# Patient Record
Sex: Male | Born: 2000 | Race: Black or African American | Hispanic: No | Marital: Single | Smoking: Never smoker
Health system: Southern US, Community
[De-identification: ages and names within clinical notes are randomized; demographics above are authoritative.]

## PROBLEM LIST (undated history)

## (undated) ENCOUNTER — Ambulatory Visit (HOSPITAL_COMMUNITY): Admission: EM | Payer: Medicaid Other | Source: Home / Self Care

---
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
## 2022-05-28 ENCOUNTER — Encounter (HOSPITAL_COMMUNITY): Payer: Self-pay | Admitting: *Deleted

## 2022-05-28 ENCOUNTER — Ambulatory Visit (HOSPITAL_COMMUNITY)
Admission: EM | Admit: 2022-05-28 | Discharge: 2022-05-28 | Disposition: A | Discharge status: Home / Self Care | Payer: Medicaid Other | Attending: Emergency Medicine | Admitting: Emergency Medicine

## 2022-05-28 ENCOUNTER — Other Ambulatory Visit: Payer: Self-pay

## 2022-05-28 DIAGNOSIS — N489 Disorder of penis, unspecified: Secondary | ICD-10-CM | POA: Insufficient documentation

## 2022-05-28 DIAGNOSIS — Z202 Contact with and (suspected) exposure to infections with a predominantly sexual mode of transmission: Secondary | ICD-10-CM | POA: Insufficient documentation

## 2022-05-28 MED ORDER — DOXYCYCLINE HYCLATE 100 MG PO CAPS: 100.0000 mg | ORAL_CAPSULE | Freq: Two times a day (BID) | ORAL | 0 refills | Status: DC
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
## 2022-05-28 NOTE — Discharge Instructions (Addendum)
Lesions to the penis at this time or not consistent with herpes however please monitor closely and if they occur again and appear like blisters that are painful or itchy please return to get checked  Today you are being treated prophylactically for chlamydia, take doxycycline every morning and every evening for 7 days, please refrain from having sex until you and all partners have completed treatment   Labs pending 2-3 days, you will be contacted if positive for any sti and treatment will be sent to the pharmacy, you will have to return to the clinic if positive for gonorrhea to receive treatment   Please refrain from having sex until labs results, if positive please refrain from having sex until treatment complete and symptoms resolve   If positive for Chlamydia  gonorrhea or trichomoniasis please notify partner or partners so they may tested as well  Moving forward, it is recommended you use some form of protection against the transmission of sti infections  such as condoms or dental dams with each sexual encounter

## 2022-05-28 NOTE — ED Triage Notes (Addendum)
Pt reports Partner tested positive for STD. Pt reports he has white spots on his penis and skin is red.

## 2022-05-31 LAB — CYTOLOGY, (ORAL, ANAL, URETHRAL) ANCILLARY ONLY
Chlamydia: POSITIVE — AB
Comment: NEGATIVE
Comment: NEGATIVE
Comment: NORMAL
Neisseria Gonorrhea: NEGATIVE
Trichomonas: NEGATIVE

## 2023-04-27 ENCOUNTER — Other Ambulatory Visit: Payer: Self-pay

## 2023-04-27 ENCOUNTER — Emergency Department (HOSPITAL_BASED_OUTPATIENT_CLINIC_OR_DEPARTMENT_OTHER)
Admission: EM | Admit: 2023-04-27 | Discharge: 2023-04-27 | Disposition: A | Discharge status: Home / Self Care | Attending: Emergency Medicine | Admitting: Emergency Medicine

## 2023-04-27 ENCOUNTER — Encounter (HOSPITAL_BASED_OUTPATIENT_CLINIC_OR_DEPARTMENT_OTHER): Payer: Self-pay | Admitting: Emergency Medicine

## 2023-04-27 ENCOUNTER — Emergency Department (HOSPITAL_BASED_OUTPATIENT_CLINIC_OR_DEPARTMENT_OTHER)

## 2023-04-27 DIAGNOSIS — N50811 Right testicular pain: Secondary | ICD-10-CM | POA: Diagnosis present

## 2023-04-27 DIAGNOSIS — N451 Epididymitis: Secondary | ICD-10-CM | POA: Diagnosis not present

## 2023-04-27 LAB — URINALYSIS, W/ REFLEX TO CULTURE (INFECTION SUSPECTED)
Bacteria, UA: NONE SEEN
Bilirubin Urine: NEGATIVE
Glucose, UA: NEGATIVE mg/dL
Hgb urine dipstick: NEGATIVE
Ketones, ur: NEGATIVE mg/dL
Leukocytes,Ua: NEGATIVE
Nitrite: NEGATIVE
Specific Gravity, Urine: 1.031 — ABNORMAL HIGH (ref 1.005–1.030)
pH: 6.5 (ref 5.0–8.0)

## 2023-04-27 LAB — CBC WITH DIFFERENTIAL/PLATELET
Abs Immature Granulocytes: 0.02 10*3/uL (ref 0.00–0.07)
Basophils Absolute: 0 10*3/uL (ref 0.0–0.1)
Basophils Relative: 1 %
Eosinophils Absolute: 0 10*3/uL (ref 0.0–0.5)
Eosinophils Relative: 0 %
HCT: 43.6 % (ref 39.0–52.0)
Hemoglobin: 14.6 g/dL (ref 13.0–17.0)
Immature Granulocytes: 0 %
Lymphocytes Relative: 23 %
Lymphs Abs: 1.8 10*3/uL (ref 0.7–4.0)
MCH: 30 pg (ref 26.0–34.0)
MCHC: 33.5 g/dL (ref 30.0–36.0)
MCV: 89.5 fL (ref 80.0–100.0)
Monocytes Absolute: 0.8 10*3/uL (ref 0.1–1.0)
Monocytes Relative: 10 %
Neutro Abs: 5 10*3/uL (ref 1.7–7.7)
Neutrophils Relative %: 66 %
Platelets: 216 10*3/uL (ref 150–400)
RBC: 4.87 MIL/uL (ref 4.22–5.81)
RDW: 13.2 % (ref 11.5–15.5)
WBC: 7.6 10*3/uL (ref 4.0–10.5)
nRBC: 0 % (ref 0.0–0.2)

## 2023-04-27 LAB — COMPREHENSIVE METABOLIC PANEL WITH GFR
ALT: 10 U/L (ref 0–44)
AST: 9 U/L — ABNORMAL LOW (ref 15–41)
Albumin: 4.5 g/dL (ref 3.5–5.0)
Alkaline Phosphatase: 60 U/L (ref 38–126)
Anion gap: 10 (ref 5–15)
BUN: 7 mg/dL (ref 6–20)
CO2: 24 mmol/L (ref 22–32)
Calcium: 9.2 mg/dL (ref 8.9–10.3)
Chloride: 103 mmol/L (ref 98–111)
Creatinine, Ser: 0.8 mg/dL (ref 0.61–1.24)
GFR, Estimated: >60 mL/min (ref >60)
Glucose, Bld: 82 mg/dL (ref 70–99)
Potassium: 4 mmol/L (ref 3.5–5.1)
Sodium: 138 mmol/L (ref 135–145)
Total Bilirubin: 0.7 mg/dL (ref 0.0–1.2)
Total Protein: 7 g/dL (ref 6.5–8.1)

## 2023-04-27 LAB — HIV ANTIBODY (ROUTINE TESTING W REFLEX): HIV Screen 4th Generation wRfx: NONREACTIVE

## 2023-04-27 MED ORDER — DOXYCYCLINE HYCLATE 100 MG PO TABS
100.0000 mg | ORAL_TABLET | Freq: Once | ORAL | Status: AC
  Administered 2023-04-27: 100 mg via ORAL
  Filled 2023-04-27: qty 1

## 2023-04-27 MED ORDER — LIDOCAINE HCL (PF) 1 % IJ SOLN: 1.0000 mL | Freq: Once | INTRAMUSCULAR | Status: AC

## 2023-04-27 MED ORDER — CEFTRIAXONE SODIUM 500 MG IJ SOLR
500.0000 mg | Freq: Once | INTRAMUSCULAR | Status: AC
  Administered 2023-04-27: 500 mg via INTRAMUSCULAR
  Filled 2023-04-27: qty 500

## 2023-04-27 MED ORDER — LIDOCAINE HCL (PF) 1 % IJ SOLN
INTRAMUSCULAR | Status: AC
  Administered 2023-04-27: 1 mL
  Filled 2023-04-27: qty 5

## 2023-04-27 MED ORDER — DOXYCYCLINE HYCLATE 100 MG PO CAPS: 100.0000 mg | ORAL_CAPSULE | Freq: Two times a day (BID) | ORAL | 0 refills | Status: DC

## 2023-04-27 MED ORDER — OXYCODONE-ACETAMINOPHEN 5-325 MG PO TABS
1.0000 | ORAL_TABLET | Freq: Once | ORAL | Status: AC
  Administered 2023-04-27: 1 via ORAL
  Filled 2023-04-27: qty 1

## 2023-04-27 NOTE — Discharge Instructions (Addendum)
 It was a pleasure caring for you today.  Please follow-up with the urologist listed in this discharge paperwork in the next 7 days.  Please also follow-up with your primary care provider.  Seek emergency care experiencing any new or worsening symptoms.  Alternating between 650 mg Tylenol  and 400 mg Advil : The best way to alternate taking Acetaminophen  (example Tylenol ) and Ibuprofen  (example Advil /Motrin ) is to take them 3 hours apart. For example, if you take ibuprofen  at 6 am you can then take Tylenol  at 9 am. You can continue this regimen throughout the day, making sure you do not exceed the recommended maximum dose for each drug.
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
## 2023-04-27 NOTE — ED Notes (Signed)

## 2023-04-28 LAB — GC/CHLAMYDIA PROBE AMP (~~LOC~~) NOT AT ARMC
Chlamydia: NEGATIVE
Comment: NEGATIVE
Comment: NORMAL
Neisseria Gonorrhea: NEGATIVE

## 2023-08-21 ENCOUNTER — Ambulatory Visit (HOSPITAL_COMMUNITY): Admission: EM | Admit: 2023-08-21 | Discharge: 2023-08-21 | Disposition: A | Discharge status: Home / Self Care

## 2023-08-21 ENCOUNTER — Ambulatory Visit (HOSPITAL_COMMUNITY)
Admission: EM | Admit: 2023-08-21 | Discharge: 2023-08-21 | Disposition: A | Discharge status: Home / Self Care | Attending: Internal Medicine | Admitting: Internal Medicine

## 2023-08-21 ENCOUNTER — Other Ambulatory Visit: Payer: Self-pay

## 2023-08-21 ENCOUNTER — Encounter (HOSPITAL_COMMUNITY): Payer: Self-pay | Admitting: Emergency Medicine

## 2023-08-21 DIAGNOSIS — Z202 Contact with and (suspected) exposure to infections with a predominantly sexual mode of transmission: Secondary | ICD-10-CM | POA: Insufficient documentation

## 2023-08-21 DIAGNOSIS — Z113 Encounter for screening for infections with a predominantly sexual mode of transmission: Secondary | ICD-10-CM | POA: Diagnosis present

## 2023-08-21 MED ORDER — CEFTRIAXONE SODIUM 500 MG IJ SOLR
500.0000 mg | INTRAMUSCULAR | Status: DC
  Administered 2023-08-21: 500 mg via INTRAMUSCULAR

## 2023-08-21 MED ORDER — CEFTRIAXONE SODIUM 500 MG IJ SOLR
INTRAMUSCULAR | Status: AC
  Filled 2023-08-21: qty 500

## 2023-08-21 MED ORDER — LIDOCAINE HCL (PF) 1 % IJ SOLN
INTRAMUSCULAR | Status: AC
  Filled 2023-08-21: qty 2

## 2023-08-21 NOTE — ED Triage Notes (Signed)
 Patient has concerned that his previous partner was positive for gonorrhea.  Requests testing and treatment.  Has had pain and penile discharge

## 2023-08-21 NOTE — ED Provider Notes (Signed)
 [facility]    CSN: [id number] Arrival date & time: 08/21/23  1619      History   Chief Complaint Chief Complaint  Patient presents with   Exposure to STD    HPI Mathew Thornton is a 23 y.o. male.   23 year old male presents urgent care requesting STI testing.  He reports that he is girlfriend let him know that she tested positive for gonorrhea.  He is also requesting treatment of this.  He does report some penile pain and penile discharge.  He denies fever, chills, nausea, vomiting, abdominal pain.   Exposure to STD Pertinent negatives include no chest pain, no abdominal pain and no shortness of breath.    History reviewed. No pertinent past medical history.  There are no active problems to display for this patient.   History reviewed. No pertinent surgical history.     Home Medications    Prior to Admission medications   Medication Sig Start Date End Date Taking? Authorizing Provider  bacitracin  500 UNIT/GM ointment Apply 1 application topically 2 (two) times daily. Patient not taking: Reported on 08/21/2023 09/05/15   Eilleen Colander, NP  doxycycline  (VIBRAMYCIN ) 100 MG capsule Take 1 capsule (100 mg total) by mouth 2 (two) times daily. Patient not taking: Reported on 08/21/2023 04/27/23   Hoy Nidia FALCON, PA-C  ibuprofen  (ADVIL ,MOTRIN ) 400 MG tablet Take 1 tablet (400 mg total) by mouth every 6 (six) hours as needed. Patient not taking: Reported on 08/21/2023 11/13/13   Catherne Nest, PA-C    Family History History reviewed. No pertinent family history.  Social History Social History   Tobacco Use   Smoking status: Never   Smokeless tobacco: Never  Vaping Use   Vaping status: Never Used  Substance Use Topics   Alcohol use: Never   Drug use: Never     Allergies   Patient has no known allergies.   Review of Systems Review of Systems  Constitutional:  Negative for chills and fever.  HENT:  Negative for ear pain and sore throat.    Eyes:  Negative for pain and visual disturbance.  Respiratory:  Negative for cough and shortness of breath.   Cardiovascular:  Negative for chest pain and palpitations.  Gastrointestinal:  Negative for abdominal pain and vomiting.  Genitourinary:  Positive for penile discharge and penile pain. Negative for dysuria and hematuria.  Musculoskeletal:  Negative for arthralgias and back pain.  Skin:  Negative for color change and rash.  Neurological:  Negative for seizures and syncope.  All other systems reviewed and are negative.    Physical Exam Triage Vital Signs ED Triage Vitals  Encounter Vitals Group     BP 08/21/23 1734 105/70     Girls Systolic BP Percentile --      Girls Diastolic BP Percentile --      Boys Systolic BP Percentile --      Boys Diastolic BP Percentile --      Pulse Rate 08/21/23 1734 61     Resp 08/21/23 1734 15     Temp 08/21/23 1734 98.4 F (36.9 C)     Temp Source 08/21/23 1734 Oral     SpO2 08/21/23 1734 100 %     Weight --      Height --      Head Circumference --      Peak Flow --      Pain Score 08/21/23 1732 0     Pain Loc --  Pain Education --      Exclude from Growth Chart --    No data found.  Updated Vital Signs BP 105/70 (BP Location: Right Arm)   Pulse 61   Temp 98.4 F (36.9 C) (Oral)   Resp 15   SpO2 100%   Visual Acuity Right Eye Distance:   Left Eye Distance:   Bilateral Distance:    Right Eye Near:   Left Eye Near:    Bilateral Near:     Physical Exam Vitals and nursing note reviewed.  Constitutional:      General: He is not in acute distress.    Appearance: He is well-developed.  HENT:     Head: Normocephalic and atraumatic.  Eyes:     Conjunctiva/sclera: Conjunctivae normal.  Cardiovascular:     Rate and Rhythm: Normal rate and regular rhythm.     Heart sounds: No murmur heard. Pulmonary:     Effort: Pulmonary effort is normal. No respiratory distress.     Breath sounds: Normal breath sounds.   Abdominal:     Palpations: Abdomen is soft.     Tenderness: There is no abdominal tenderness.  Musculoskeletal:        General: No swelling.     Cervical back: Neck supple.  Skin:    General: Skin is warm and dry.     Capillary Refill: Capillary refill takes less than 2 seconds.  Neurological:     Mental Status: He is alert.  Psychiatric:        Mood and Affect: Mood normal.      UC Treatments / Results  Labs (all labs ordered are listed, but only abnormal results are displayed) Labs Reviewed  CYTOLOGY, (ORAL, ANAL, URETHRAL) ANCILLARY ONLY    EKG   Radiology No results found.  Procedures Procedures (including critical care time)  Medications Ordered in UC Medications  cefTRIAXone  (ROCEPHIN ) injection 500 mg (has no administration in time range)    Initial Impression / Assessment and Plan / UC Course  I have reviewed the triage vital signs and the nursing notes.  Pertinent labs & imaging results that were available during my care of the patient were reviewed by me and considered in my medical decision making (see chart for details).     Screening examination for STI  Exposure to gonorrhea   Known gonorrhea exposure.  We have treated this today with an injection of ceftriaxone .  Screening swab done today and results will be available in 24-48 hours. We will contact you if we need to arrange additional treatment based on your testing. Negative results will be on your MyChart account. Abstain from sex until you receive your final results.  Use a condom for sexual encounters. If you have any worsening or changing symptoms including abnormal discharge, abdominal pain, fever, nausea, or vomiting, then you should be reevaluated.    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  We have treated this today with an injection of  ceftriaxone .  Screening swab done today and results will be available in 24-48 hours. We will contact you if we need to arrange additional treatment based on your testing. Negative results will be on your MyChart account. Abstain from sex until you receive your final results.  Use a condom for sexual encounters. If you have any worsening or changing symptoms including abnormal discharge,  abdominal pain, fever, nausea, or vomiting, then you should be reevaluated.      ED Prescriptions   None    PDMP not reviewed this encounter.   Teresa Almarie LABOR, [place] 08/21/23 [phone]

## 2023-08-21 NOTE — Discharge Instructions (Addendum)
 Known gonorrhea exposure.  We have treated this today with an injection of ceftriaxone .  Screening swab done today and results will be available in 24-48 hours. We will contact you if we need to arrange additional treatment based on your testing. Negative results will be on your MyChart account. Abstain from sex until you receive your final results.  Use a condom for sexual encounters. If you have any worsening or changing symptoms including abnormal discharge, abdominal pain, fever, nausea, or vomiting, then you should be reevaluated.

## 2023-08-22 ENCOUNTER — Ambulatory Visit (HOSPITAL_COMMUNITY): Payer: Self-pay

## 2023-08-22 LAB — CYTOLOGY, (ORAL, ANAL, URETHRAL) ANCILLARY ONLY
Chlamydia: POSITIVE — AB
Comment: NEGATIVE
Comment: NEGATIVE
Comment: NORMAL
Neisseria Gonorrhea: NEGATIVE
Trichomonas: NEGATIVE

## 2023-08-22 MED ORDER — DOXYCYCLINE HYCLATE 100 MG PO TABS: 100.0000 mg | ORAL_TABLET | Freq: Two times a day (BID) | ORAL | 0 refills | Status: AC

## 2023-11-08 ENCOUNTER — Ambulatory Visit (HOSPITAL_COMMUNITY)
Admission: EM | Admit: 2023-11-08 | Discharge: 2023-11-08 | Disposition: A | Discharge status: Home / Self Care | Attending: Physician Assistant | Admitting: Physician Assistant

## 2023-11-08 ENCOUNTER — Other Ambulatory Visit: Payer: Self-pay

## 2023-11-08 ENCOUNTER — Encounter (HOSPITAL_COMMUNITY): Payer: Self-pay

## 2023-11-08 ENCOUNTER — Emergency Department (HOSPITAL_COMMUNITY)
Admission: EM | Admit: 2023-11-08 | Discharge: 2023-11-09 | Discharge status: Against Medical Advice | Source: Ambulatory Visit | Attending: Emergency Medicine | Admitting: Emergency Medicine

## 2023-11-08 ENCOUNTER — Emergency Department (HOSPITAL_COMMUNITY)

## 2023-11-08 DIAGNOSIS — L089 Local infection of the skin and subcutaneous tissue, unspecified: Secondary | ICD-10-CM | POA: Diagnosis present

## 2023-11-08 DIAGNOSIS — Z113 Encounter for screening for infections with a predominantly sexual mode of transmission: Secondary | ICD-10-CM | POA: Diagnosis not present

## 2023-11-08 DIAGNOSIS — L02215 Cutaneous abscess of perineum: Secondary | ICD-10-CM | POA: Insufficient documentation

## 2023-11-08 DIAGNOSIS — Z5321 Procedure and treatment not carried out due to patient leaving prior to being seen by health care provider: Secondary | ICD-10-CM | POA: Insufficient documentation

## 2023-11-08 DIAGNOSIS — R509 Fever, unspecified: Secondary | ICD-10-CM | POA: Diagnosis not present

## 2023-11-08 DIAGNOSIS — A749 Chlamydial infection, unspecified: Secondary | ICD-10-CM | POA: Diagnosis not present

## 2023-11-08 DIAGNOSIS — K610 Anal abscess: Secondary | ICD-10-CM | POA: Diagnosis not present

## 2023-11-08 DIAGNOSIS — A599 Trichomoniasis, unspecified: Secondary | ICD-10-CM | POA: Diagnosis not present

## 2023-11-08 LAB — CBC WITH DIFFERENTIAL/PLATELET
Abs Immature Granulocytes: 0.07 K/uL (ref 0.00–0.07)
Basophils Absolute: 0.1 K/uL (ref 0.0–0.1)
Basophils Relative: 0 %
Eosinophils Absolute: 0 K/uL (ref 0.0–0.5)
Eosinophils Relative: 0 %
HCT: 46.4 % (ref 39.0–52.0)
Hemoglobin: 15.4 g/dL (ref 13.0–17.0)
Immature Granulocytes: 0 %
Lymphocytes Relative: 12 %
Lymphs Abs: 2.1 K/uL (ref 0.7–4.0)
MCH: 30.1 pg (ref 26.0–34.0)
MCHC: 33.2 g/dL (ref 30.0–36.0)
MCV: 90.6 fL (ref 80.0–100.0)
Monocytes Absolute: 2.2 K/uL — ABNORMAL HIGH (ref 0.1–1.0)
Monocytes Relative: 13 %
Neutro Abs: 12.3 K/uL — ABNORMAL HIGH (ref 1.7–7.7)
Neutrophils Relative %: 75 %
Platelets: 225 K/uL (ref 150–400)
RBC: 5.12 MIL/uL (ref 4.22–5.81)
RDW: 13.6 % (ref 11.5–15.5)
WBC: 16.7 K/uL — ABNORMAL HIGH (ref 4.0–10.5)
nRBC: 0 % (ref 0.0–0.2)

## 2023-11-08 LAB — BASIC METABOLIC PANEL WITH GFR
Anion gap: 10 (ref 5–15)
BUN: 9 mg/dL (ref 6–20)
CO2: 28 mmol/L (ref 22–32)
Calcium: 9.1 mg/dL (ref 8.9–10.3)
Chloride: 100 mmol/L (ref 98–111)
Creatinine, Ser: 0.84 mg/dL (ref 0.61–1.24)
GFR, Estimated: >60 mL/min (ref >60)
Glucose, Bld: 91 mg/dL (ref 70–99)
Potassium: 3.6 mmol/L (ref 3.5–5.1)
Sodium: 138 mmol/L (ref 135–145)

## 2023-11-08 MED ORDER — IOHEXOL 350 MG/ML SOLN
75.0000 mL | Freq: Once | INTRAVENOUS | Status: AC | PRN
  Administered 2023-11-08: 75 mL via INTRAVENOUS

## 2023-11-08 MED ORDER — OXYCODONE-ACETAMINOPHEN 5-325 MG PO TABS
1.0000 | ORAL_TABLET | ORAL | Status: DC | PRN
  Administered 2023-11-08: 1 via ORAL
  Filled 2023-11-08: qty 1

## 2023-11-08 NOTE — ED Triage Notes (Signed)
 Patient here today with c/o an abscess between his scrotum and anus X 1 week. Patient states that it busted yesterday.   Patient would also like to be tested for Stds. Swab only. Denies symptoms.

## 2023-11-08 NOTE — ED Provider Triage Note (Signed)
 Emergency Medicine Provider Triage Evaluation Note  Mathew Thornton , a 23 y.o. male  was evaluated in triage.  Pt complains of perineal abscess x 8 days. Seen by UC and told to come here for imaging. Had abscesses in past.    Endorses subjective fevers, body aches, chills  Denies headaches, vision changes, chest pain, shortness of breath, abdominal pain, n/v/d, dysuria, discharge.   Review of Systems  Positive: N/a Negative: N/a  Physical Exam  BP 138/85 (BP Location: Left Arm)   Pulse 83   Temp 99.1 F (37.3 C) (Oral)   Resp 16   Ht 5' 11 (1.803 m)   Wt 68 kg   SpO2 97%   BMI 20.92 kg/m  Gen:   Awake, no distress   Resp:  Normal effort  MSK:   Moves extremities without difficulty  Other:    Medical Decision Making  Medically screening exam initiated at 2:58 PM.  Appropriate orders placed.  Mathew Thornton was informed that the remainder of the evaluation will be completed by another provider, this initial triage assessment does not replace that evaluation, and the importance of remaining in the ED until their evaluation is complete.     Beola Terrall RAMAN, PA-C 11/08/23 1500

## 2023-11-08 NOTE — ED Notes (Signed)
 Patient is being discharged from the Urgent Care and sent to the Emergency Department via private vehicle . Per provider, patient is in need of higher level of care due to possible deep perineum infection. Patient is aware and verbalizes understanding of plan of care.  Vitals:   11/08/23 1308  BP: 135/74  Pulse: 78  Resp: 16  Temp: 98.7 F (37.1 C)  SpO2: 98%

## 2023-11-08 NOTE — ED Provider Notes (Signed)
 [facility]    CSN: [id number] Arrival date & time: 11/08/23  1139      History   Chief Complaint Chief Complaint  Patient presents with   Abscess   SEXUALLY TRANSMITTED DISEASE    HPI Mathew Thornton is a 23 y.o. male.  has no past medical history on file.   HPI  Pt is here today for a concern of a bump between the scrotum and rectum that has been present for about 7-8 days. He states he tried to pop it yesterday with scant amount of drainage. He reports most of the material popped on the inside. He would also like STD screening today  He denies symptoms such as dysuria, penile discharge, abdominal pain He states he did have chills last night and had to get multiple covers to help with this.   He states the bump is affecting his walking and ability to sit due to discomfort He denies previous hx of diabetes or immune system compromise.  He denies that he waxes, shaves or trims the area affected by the bump but is concerned for potential ingrown hair.    History reviewed. No pertinent past medical history.  There are no active problems to display for this patient.   History reviewed. No pertinent surgical history.     Home Medications    Prior to Admission medications   Not on File    Family History History reviewed. No pertinent family history.  Social History Social History   Tobacco Use   Smoking status: Never   Smokeless tobacco: Never  Vaping Use   Vaping status: Never Used  Substance Use Topics   Alcohol use: Never   Drug use: Not Currently     Allergies   Patient has no known allergies.   Review of Systems Review of Systems  Constitutional:  Positive for chills. Negative for fever.  Gastrointestinal:  Negative for abdominal pain.  Genitourinary:  Positive for testicular pain. Negative for dysuria, penile discharge, penile pain, penile swelling and scrotal swelling.  Skin:        Concern for abscess in genital region       Physical Exam Triage Vital Signs ED Triage Vitals  Encounter Vitals Group     BP 11/08/23 1308 135/74     Girls Systolic BP Percentile --      Girls Diastolic BP Percentile --      Boys Systolic BP Percentile --      Boys Diastolic BP Percentile --      Pulse Rate 11/08/23 1308 78     Resp 11/08/23 1308 16     Temp 11/08/23 1308 98.7 F (37.1 C)     Temp Source 11/08/23 1308 Oral     SpO2 11/08/23 1308 98 %     Weight --      Height --      Head Circumference --      Peak Flow --      Pain Score 11/08/23 1307 7     Pain Loc --      Pain Education --      Exclude from Growth Chart --    No data found.  Updated Vital Signs BP 135/74 (BP Location: Left Arm)   Pulse 78   Temp 98.7 F (37.1 C) (Oral)   Resp 16   SpO2 98%   Visual Acuity Right Eye Distance:   Left Eye Distance:   Bilateral Distance:    Right Eye Near:  Left Eye Near:    Bilateral Near:     Physical Exam Vitals reviewed. Exam conducted with a chaperone present.  Constitutional:      General: He is awake.     Appearance: Normal appearance. He is well-developed and well-groomed.  HENT:     Head: Normocephalic and atraumatic.  Eyes:     Extraocular Movements: Extraocular movements intact.     Conjunctiva/sclera: Conjunctivae normal.  Pulmonary:     Effort: Pulmonary effort is normal.  Genitourinary:     Comments: Chaperoned by Donnice Ferry, CMA   Musculoskeletal:     Cervical back: Normal range of motion.  Neurological:     Mental Status: He is alert and oriented to person, place, and time.  Psychiatric:        Attention and Perception: Attention normal.        Mood and Affect: Mood normal.        Speech: Speech normal.        Behavior: Behavior normal. Behavior is cooperative.      UC Treatments / Results  Labs (all labs ordered are listed, but only abnormal results are displayed) Labs Reviewed  CYTOLOGY, (ORAL, ANAL, URETHRAL) ANCILLARY ONLY    EKG   Radiology No  results found.  Procedures Procedures (including critical care time)  Medications Ordered in UC Medications - No data to display  Initial Impression / Assessment and Plan / UC Course  I have reviewed the triage vital signs and the nursing notes.  Pertinent labs & imaging results that were available during my care of the patient were reviewed by me and considered in my medical decision making (see chart for details).      Final Clinical Impressions(s) / UC Diagnoses   Final diagnoses:  Screening examination for STD (sexually transmitted disease)  Perineal abscess    Pt presents today with concerns for an abscess along the perineum that has been progressively worsening over the last week. He reports scant drainage after trying to pop the area but states he is now having difficulty walking and sitting due to pain. Physical exam is notable for approx 6-7 cm long abscess with evidence of drainage. Abscess appears to have mixed areas of fluctuance and induration.  Reviewed with patient that I am concerned that he may have a deep tissue infection considering the area involved.  Recommend evaluation in the emergency room with appropriate imaging to assess degree of involvement.  Reviewed with patient that as long as emergency room providers feel comfortable they may elect to do I&D procedure there as well as start antibiotics.  Patient is amenable to going to the ER for evaluation.  He declines EMS transportation.   Discharge Instructions      You were seen today for concerns of an abscess along the perineal space.  I am concerned you may have a deep tissue infection due to the size of the abscess and the fact that you have had chills and worsening pain and symptoms.  Please go to the ER for evaluation and imaging to rule out a more severe infection and for incision and drainage if deemed appropriate.      ED Prescriptions   None    PDMP not reviewed this encounter.   Marylene Rocky BRAVO,  PA-C 11/08/23 1553

## 2023-11-08 NOTE — ED Triage Notes (Signed)
 Pt has abscess under testicles for 10 days, tried to pop it a couple days ago and it got bigger and more painful. Pt states it is red and warm to touch. Pt says slight fever and chills in past few days.

## 2023-11-08 NOTE — Discharge Instructions (Addendum)
 You were seen today for concerns of an abscess along the perineal space.  I am concerned you may have a deep tissue infection due to the size of the abscess and the fact that you have had chills and worsening pain and symptoms.  Please go to the ER for evaluation and imaging to rule out a more severe infection and for incision and drainage if deemed appropriate.

## 2023-11-09 ENCOUNTER — Encounter (HOSPITAL_COMMUNITY): Payer: Self-pay

## 2023-11-09 ENCOUNTER — Emergency Department (HOSPITAL_COMMUNITY)
Admission: EM | Admit: 2023-11-09 | Discharge: 2023-11-09 | Disposition: A | Discharge status: Home / Self Care | Source: Home / Self Care | Attending: Emergency Medicine | Admitting: Emergency Medicine

## 2023-11-09 ENCOUNTER — Telehealth (HOSPITAL_COMMUNITY): Payer: Self-pay

## 2023-11-09 ENCOUNTER — Other Ambulatory Visit: Payer: Self-pay

## 2023-11-09 DIAGNOSIS — A749 Chlamydial infection, unspecified: Secondary | ICD-10-CM | POA: Insufficient documentation

## 2023-11-09 DIAGNOSIS — K610 Anal abscess: Secondary | ICD-10-CM | POA: Insufficient documentation

## 2023-11-09 DIAGNOSIS — A599 Trichomoniasis, unspecified: Secondary | ICD-10-CM | POA: Insufficient documentation

## 2023-11-09 DIAGNOSIS — L089 Local infection of the skin and subcutaneous tissue, unspecified: Secondary | ICD-10-CM

## 2023-11-09 DIAGNOSIS — L0291 Cutaneous abscess, unspecified: Secondary | ICD-10-CM

## 2023-11-09 LAB — CBC WITH DIFFERENTIAL/PLATELET
Abs Immature Granulocytes: 0.07 K/uL (ref 0.00–0.07)
Basophils Absolute: 0 K/uL (ref 0.0–0.1)
Basophils Relative: 0 %
Eosinophils Absolute: 0.1 K/uL (ref 0.0–0.5)
Eosinophils Relative: 1 %
HCT: 48.5 % (ref 39.0–52.0)
Hemoglobin: 16 g/dL (ref 13.0–17.0)
Immature Granulocytes: 1 %
Lymphocytes Relative: 16 %
Lymphs Abs: 1.9 K/uL (ref 0.7–4.0)
MCH: 30 pg (ref 26.0–34.0)
MCHC: 33 g/dL (ref 30.0–36.0)
MCV: 91 fL (ref 80.0–100.0)
Monocytes Absolute: 1.3 K/uL — ABNORMAL HIGH (ref 0.1–1.0)
Monocytes Relative: 11 %
Neutro Abs: 8.5 K/uL — ABNORMAL HIGH (ref 1.7–7.7)
Neutrophils Relative %: 71 %
Platelets: 207 K/uL (ref 150–400)
RBC: 5.33 MIL/uL (ref 4.22–5.81)
RDW: 13.6 % (ref 11.5–15.5)
WBC: 11.9 K/uL — ABNORMAL HIGH (ref 4.0–10.5)
nRBC: 0 % (ref 0.0–0.2)

## 2023-11-09 LAB — COMPREHENSIVE METABOLIC PANEL WITH GFR
ALT: 12 U/L (ref 0–44)
AST: 10 U/L — ABNORMAL LOW (ref 15–41)
Albumin: 4.1 g/dL (ref 3.5–5.0)
Alkaline Phosphatase: 55 U/L (ref 38–126)
Anion gap: 10 (ref 5–15)
BUN: 7 mg/dL (ref 6–20)
CO2: 26 mmol/L (ref 22–32)
Calcium: 9.1 mg/dL (ref 8.9–10.3)
Chloride: 103 mmol/L (ref 98–111)
Creatinine, Ser: 1.19 mg/dL (ref 0.61–1.24)
GFR, Estimated: >60 mL/min (ref >60)
Glucose, Bld: 103 mg/dL — ABNORMAL HIGH (ref 70–99)
Potassium: 3.4 mmol/L — ABNORMAL LOW (ref 3.5–5.1)
Sodium: 139 mmol/L (ref 135–145)
Total Bilirubin: 1.6 mg/dL — ABNORMAL HIGH (ref 0.0–1.2)
Total Protein: 7.5 g/dL (ref 6.5–8.1)

## 2023-11-09 LAB — CYTOLOGY, (ORAL, ANAL, URETHRAL) ANCILLARY ONLY
Chlamydia: POSITIVE — AB
Comment: NEGATIVE
Comment: NEGATIVE
Comment: NORMAL
Neisseria Gonorrhea: NEGATIVE
Trichomonas: POSITIVE — AB

## 2023-11-09 LAB — PROTIME-INR
INR: 1.1 (ref 0.8–1.2)
Prothrombin Time: 14.6 s (ref 11.4–15.2)

## 2023-11-09 LAB — I-STAT CG4 LACTIC ACID, ED: Lactic Acid, Venous: 1.2 mmol/L (ref 0.5–1.9)

## 2023-11-09 MED ORDER — LIDOCAINE-EPINEPHRINE-TETRACAINE (LET) TOPICAL GEL
3.0000 mL | Freq: Once | TOPICAL | Status: AC
  Administered 2023-11-09: 3 mL via TOPICAL
  Filled 2023-11-09: qty 3

## 2023-11-09 MED ORDER — DOXYCYCLINE HYCLATE 100 MG PO CAPS: 100.0000 mg | ORAL_CAPSULE | Freq: Two times a day (BID) | ORAL | 0 refills | Status: AC

## 2023-11-09 MED ORDER — OXYCODONE HCL 5 MG PO TABS: 5.0000 mg | ORAL_TABLET | ORAL | 0 refills | Status: AC | PRN

## 2023-11-09 MED ORDER — METRONIDAZOLE 500 MG PO TABS: 2000.0000 mg | ORAL_TABLET | Freq: Once | ORAL | 0 refills | Status: AC

## 2023-11-09 MED ORDER — DOXYCYCLINE HYCLATE 100 MG PO TABS
100.0000 mg | ORAL_TABLET | ORAL | Status: AC
  Administered 2023-11-09: 100 mg via ORAL
  Filled 2023-11-09: qty 1

## 2023-11-09 MED ORDER — METRONIDAZOLE 500 MG PO TABS
500.0000 mg | ORAL_TABLET | ORAL | Status: AC
  Administered 2023-11-09: 500 mg via ORAL
  Filled 2023-11-09: qty 1

## 2023-11-09 NOTE — ED Triage Notes (Addendum)
 Pt is here for evaluation of Perineal Abscess.  Pt was seen at [facility] and told to come to ED yesterday and pt did come but left due to wait.  WBC was 16.7 yesterday. Pt reports fever and chills and states that the swelling in the area has increased.

## 2023-11-09 NOTE — Telephone Encounter (Signed)
Patient called regarding lab results. Please advise

## 2023-11-09 NOTE — ED Notes (Addendum)
 Pt verbalized understanding of discharge instructions. Opportunity for questions provided.  Pt given additional wound supplies.

## 2023-11-09 NOTE — ED Provider Notes (Signed)
 [place] EMERGENCY DEPARTMENT AT [facility] Provider Note   CSN: [id number] Arrival date & time: 11/09/23  1346     Patient presents with: Recurrent Skin Infections and Perineal Abscess   Mathew Thornton is a 23 y.o. male.   23 year old male who presents emergency department perineal pain.  Patient reports that for the past week he has had a bump on his perineum.  Says that this then spontaneously draining but that he has also attempted to pop it several times.  Has had some chills and went to urgent care yesterday who referred him to the emergency department for advanced imaging.  Had a CT of his pelvis with IV contrast that showed a phlegmon without obvious abscess but left prior to treatment because of the long wait times.  Also had STI testing at urgent care that showed that he was positive for chlamydia and trichomonas.  Says he had night sweats yesterday but has not taken his temperature aside from his healthcare visits.       Prior to Admission medications   Medication Sig Start Date End Date Taking? Authorizing Provider  doxycycline  (VIBRAMYCIN ) 100 MG capsule Take 1 capsule (100 mg total) by mouth 2 (two) times daily for 7 days. 11/09/23 11/16/23 Yes Yolande Lamar BROCKS, MD  oxyCODONE  (ROXICODONE ) 5 MG immediate release tablet Take 1 tablet (5 mg total) by mouth every 4 (four) hours as needed for severe pain (pain score 7-10). 11/09/23  Yes Yolande Lamar BROCKS, MD    Allergies: Patient has no known allergies.    Review of Systems  Updated Vital Signs BP 119/74   Pulse 71   Temp 98.2 F (36.8 C)   Resp 18   SpO2 100%   Physical Exam Constitutional:      Appearance: Normal appearance.  Genitourinary:    Penis: Normal.      Testes: Normal.     Comments: Chaperoned by RN Luster.  Patient has area of induration approximately 3 cm x 1-1/2 cm in his perineum.  No fluctuance palpated.  No subcutaneous emphysema.  Mild amount of erythema overlying it. Neurological:      Mental Status: He is alert.     (all labs ordered are listed, but only abnormal results are displayed) Labs Reviewed  COMPREHENSIVE METABOLIC PANEL WITH GFR - Abnormal; Notable for the following components:      Result Value   Potassium 3.4 (*)    Glucose, Bld 103 (*)    AST 10 (*)    Total Bilirubin 1.6 (*)    All other components within normal limits  CBC WITH DIFFERENTIAL/PLATELET - Abnormal; Notable for the following components:   WBC 11.9 (*)    Neutro Abs 8.5 (*)    Monocytes Absolute 1.3 (*)    All other components within normal limits  CULTURE, BLOOD (ROUTINE X 2)  CULTURE, BLOOD (ROUTINE X 2)  PROTIME-INR  I-STAT CG4 LACTIC ACID, ED    EKG: None  Radiology: No results found.    Procedures  EMERGENCY DEPARTMENT US  SOFT TISSUE INTERPRETATION Study: Limited Soft Tissue Ultrasound  INDICATIONS: Soft tissue infection Multiple views of the body part were obtained in real-time with a multi-frequency linear probe  PERFORMED BY: Myself IMAGES ARCHIVED?: Yes SIDE:Left BODY PART:perineum INTERPRETATION:  Cobblestoning noted.  No discrete fluid collection.    Medications Ordered in the ED  lidocaine -EPINEPHrine -tetracaine  (LET) topical gel (3 mLs Topical Given 11/09/23 1515)  doxycycline  (VIBRA -TABS) tablet 100 mg (100 mg Oral Given 11/09/23 1515)  metroNIDAZOLE (FLAGYL) tablet 500 mg (500 mg Oral Given 11/09/23 1515)                                    Medical Decision Making Amount and/or Complexity of Data Reviewed Labs: ordered.  Risk Prescription drug management.   23 year old male who presents to the emergency department perineal pain  Initial Ddx:  Perirectal abscess, perineal abscess, Fournier's gangrene, STI  MDM/Course:  Patient presents emergency department with an abscess on his perineum.  Has been spontaneously draining.  Yesterday had a CT scan with IV contrast that showed a phlegmon without obvious abscess.  On exam he is not septic.   Does have indurated area without obvious fluctuance to be draining somewhat.  No signs of Fournier's gangrene.  Point-of-care ultrasound today shows no discrete fluid collection amenable to drainage either.  Patient does appear to be positive for and chlamydia.  He was given a prescription of doxycycline  which will also treat the cellulitis on his perineum as well is Flagyl for the trichomonas.  He was gonorrhea negative.  Tested and treated prior to intercourse and to use protection during sex in the future  This patient presents to the ED for concern of complaints listed in HPI, this involves an extensive number of treatment options, and is a complaint that carries with it a high risk of complications and morbidity. Disposition including potential need for admission considered.   Dispo: DC Home. Return precautions discussed including, but not limited to, those listed in the AVS. Allowed pt time to ask questions which were answered fully prior to dc.  Records reviewed ED Visit Notes The following labs were independently interpreted: Chemistry and show no acute abnormality I independently reviewed the following imaging with scope of interpretation limited to determining acute life threatening conditions related to emergency care: CT Pelvis and agree with the radiologist interpretation with the following exceptions: none I personally reviewed and interpreted cardiac monitoring: normal sinus rhythm  I personally reviewed and interpreted the pt's EKG: see above for interpretation  I have reviewed the patients home medications and made adjustments as needed  Portions of this note were generated with Dragon dictation software. Dictation errors may occur despite best attempts at proofreading.     Final diagnoses:  Skin infection  Phlegmon  Chlamydia  Trichomonas infection    ED Discharge Orders          Ordered    doxycycline  (VIBRAMYCIN ) 100 MG capsule  2 times daily        11/09/23 1559     metroNIDAZOLE (FLAGYL) 500 MG tablet   Once        11/09/23 1559    oxyCODONE  (ROXICODONE ) 5 MG immediate release tablet  Every 4 hours PRN        11/09/23 1559               Yolande Lamar BROCKS, MD 11/11/23 [phone]

## 2023-11-09 NOTE — Discharge Instructions (Signed)
 You were seen for your skin infection and for chlamydia and trichomonas in the emergency department.   At home, please take the doxycycline  to treat your skin infection and chlamydia.  Take the one-time dose of Flagyl to treat your trichomonas infection as well.    Check your MyChart online for the results of any tests that had not resulted by the time you left the emergency department.   Follow-up with your primary doctor in 2-3 days regarding your visit.    Have all sexual partners tested and treated.  Do not engage in sexual intercourse until you complete your antibiotics  Return immediately to the emergency department if you experience any of the following: Worsening pain, swelling, or any other concerning symptoms.    Thank you for visiting our Emergency Department. It was a pleasure taking care of you today.

## 2023-11-09 NOTE — ED Provider Triage Note (Signed)
 Emergency Medicine Provider Triage Evaluation Note  Mathew Thornton , a 23 y.o. male  was evaluated in triage.  Pt complains of  An abscess in the perineal region.   Seen yesterday but left due to extended wait.  Had a CT scan which showed developing phlegmon.  No drainable abscess.  Denies other concerns.  Endorses fever.  Was seen at urgent care and sent here for further evaluation.  Review of Systems  Positive: As above Negative: As above  Physical Exam  BP 130/72 (BP Location: Left Arm)   Pulse 89   Temp 98.2 F (36.8 C)   Resp 14   SpO2 100%  Gen:   Awake, no distress   Resp:  Normal effort  MSK:   Moves extremities without difficulty  Other:    Medical Decision Making  Medically screening exam initiated at 2:31 PM.  Appropriate orders placed.  Mathew Thornton was informed that the remainder of the evaluation will be completed by another provider, this initial triage assessment does not replace that evaluation, and the importance of remaining in the ED until their evaluation is complete.    Hildegard Loge, PA-C 11/09/23 1432

## 2023-11-09 NOTE — Telephone Encounter (Signed)
 Noted. Since patient is in the ED, will disregard

## 2023-11-10 ENCOUNTER — Ambulatory Visit (HOSPITAL_COMMUNITY): Payer: Self-pay

## 2023-11-14 LAB — CULTURE, BLOOD (ROUTINE X 2): Culture: NO GROWTH
